# Patient Record
Sex: Female | Born: 2000 | Race: White | Hispanic: No | Marital: Single | Smoking: Never smoker
Health system: Southern US, Community
[De-identification: ages and names within clinical notes are randomized; demographics above are authoritative.]

## PROBLEM LIST (undated history)

## (undated) DIAGNOSIS — M419 Scoliosis, unspecified: Secondary | ICD-10-CM

## (undated) HISTORY — PX: ADENOIDECTOMY: SUR15

## (undated) HISTORY — PX: TONSILLECTOMY: SUR1361

---
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
## 2020-02-29 ENCOUNTER — Ambulatory Visit: Payer: Self-pay | Admitting: *Deleted

## 2020-02-29 NOTE — Telephone Encounter (Signed)
Caller requesting advise due to c/o pain between shoulder blades that is becoming consistent now. Denies chest pain or difficulty breathing. Pain noted since 4 o'clock today. Recent activity this week with moving furniture. Care advise given. Patient verbalized understanding of care advise and to seek help at urgent care or ED if symptoms worsen.   Reason for Disposition . [1] MODERATE back pain (e.g., interferes with normal activities) AND [2] present > 3 days  Answer Assessment - Initial Assessment Questions 1. ONSET: "When did the pain begin?"      4 o'clock today 2. LOCATION: "Where does it hurt?" (upper, mid or lower back)     Between shoulder blades 3. SEVERITY: "How bad is the pain?"  (e.g., Scale 1-10; mild, moderate, or severe)   - MILD (1-3): doesn't interfere with normal activities    - MODERATE (4-7): interferes with normal activities or awakens from sleep    - SEVERE (8-10): excruciating pain, unable to do any normal activities      Moderate  4. PATTERN: "Is the pain constant?" (e.g., yes, no; constant, intermittent)      Becoming more constant 5. RADIATION: "Does the pain shoot into your legs or elsewhere?"     Radiates down back but no where else 6. CAUSE:  "What do you think is causing the back pain?"      Moved furniture earlier this week 7. BACK OVERUSE:  "Any recent lifting of heavy objects, strenuous work or exercise?"     Recent lifting of furniture 8. MEDICATIONS: "What have you taken so far for the pain?" (e.g., nothing, acetaminophen, NSAIDS)     no 9. NEUROLOGIC SYMPTOMS: "Do you have any weakness, numbness, or problems with bowel/bladder control?"     na 10. OTHER SYMPTOMS: "Do you have any other symptoms?" (e.g., fever, abdominal pain, burning with urination, blood in urine)       na 11. PREGNANCY: "Is there any chance you are pregnant?" (e.g., yes, no; LMP)       na  Protocols used: BACK PAIN-A-AH

## 2021-02-11 ENCOUNTER — Encounter: Payer: Self-pay | Admitting: Emergency Medicine

## 2021-02-11 ENCOUNTER — Other Ambulatory Visit: Payer: Self-pay

## 2021-02-11 ENCOUNTER — Emergency Department (INDEPENDENT_AMBULATORY_CARE_PROVIDER_SITE_OTHER)
Admission: EM | Admit: 2021-02-11 | Discharge: 2021-02-11 | Disposition: A | Discharge status: Home / Self Care | Payer: BC Managed Care – PPO | Source: Home / Self Care | Attending: Family Medicine | Admitting: Family Medicine

## 2021-02-11 ENCOUNTER — Emergency Department (INDEPENDENT_AMBULATORY_CARE_PROVIDER_SITE_OTHER): Payer: BC Managed Care – PPO

## 2021-02-11 DIAGNOSIS — M41115 Juvenile idiopathic scoliosis, thoracolumbar region: Secondary | ICD-10-CM

## 2021-02-11 DIAGNOSIS — R52 Pain, unspecified: Secondary | ICD-10-CM

## 2021-02-11 DIAGNOSIS — M545 Low back pain, unspecified: Secondary | ICD-10-CM | POA: Diagnosis not present

## 2021-02-11 MED ORDER — NAPROXEN SODIUM 550 MG PO TABS: 550.0000 mg | ORAL_TABLET | Freq: Two times a day (BID) | ORAL | 0 refills | Status: AC

## 2021-02-11 MED ORDER — CYCLOBENZAPRINE HCL 5 MG PO TABS: 5.0000 mg | ORAL_TABLET | Freq: Three times a day (TID) | ORAL | 0 refills | Status: AC | PRN

## 2021-02-11 NOTE — Discharge Instructions (Addendum)
May use ice or heat to painful back muscles Limit bending and lifting activities Take Anaprox 2 times a day with food Take the muscle relaxer as needed.  This may cause drowsiness.  It is useful at bedtime. Call your doctor if not improving by Monday

## 2021-02-11 NOTE — ED Triage Notes (Signed)
Patient states that she began having low back pain x 1 week, worsen at night.  Since then the pain would radiate up her back to her neck.  Patient states no apparent injury, does have scoliosis.  Having hot and cold chills.  Taken Ibuprofen, denies any urinary sx's.

## 2021-02-12 NOTE — ED Provider Notes (Addendum)
[facility]    CSN: [id number] Arrival date & time: 02/11/21  1210      History   Chief Complaint Chief Complaint  Patient presents with   Back Pain    HPI Katherine Kaiser is a 20 y.o. female.   HPI  Patient has known scoliosis since she was a teenager.  She states that she was evaluated and it was determined that she did not require surgery.  She usually does not have back pain.  Currently she is having back pain for about a week.  It goes from her shoulder blade down to the low back.  She states that she did not have any fall or injury.  No overuse or new activity.  It is not localized to the flank.  No urinary symptoms or hematuria.  No either, no nausea vomiting.  She states she has had some chills.  COVID testing at home was negative. She expresses concern about her scoliosis.  It has not been evaluated in years.  She desires an x-ray Old chart is reviewed.  X-rays from 2014 reviewed.  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      Home Medications    Prior to Admission medications   Medication Sig Start Date End Date Taking? Authorizing Provider  APRI 0.15-30 MG-MCG tablet Take 1 tablet by mouth daily. 01/05/21  Yes [provider]  cyclobenzaprine (FLEXERIL) 5 MG tablet Take 1 tablet (5 mg total) by mouth 3 (three) times daily as needed for muscle spasms. 02/11/21  Yes Eustace Moore, MD  naproxen sodium (ANAPROX DS) 550 MG tablet Take 1 tablet (550 mg total) by mouth 2 (two)  times daily with a meal. 02/11/21  Yes Eustace Moore, MD    Family History Family History  Problem Relation Age of Onset   Hyperlipidemia Mother    Other Mother        ?factor S deficiency   Fibromyalgia Mother    Diabetes Mother        borderline   Polycystic ovary syndrome Mother    Cancer Paternal Grandmother        non hodgkins lymphoma   Hypertension Maternal Grandfather     Social History Social History   Tobacco Use   Smoking status: Never   Smokeless tobacco: Never  Substance Use Topics   Alcohol use: No   Drug use: No     Allergies   Patient has no known allergies.   Review of Systems Review of Systems See HPI  Physical Exam Triage Vital Signs ED Triage Vitals  Enc Vitals Group     BP 02/11/21 1255 116/84     Pulse Rate 02/11/21 1255 90     Resp --      Temp 02/11/21 1255 98.8 F (37.1 C)     Temp Source 02/11/21 1255 Oral     SpO2 02/11/21 1255 100 %     Weight 02/11/21 1256 155 lb (  70.3 kg)     Height 02/11/21 1256 5\' 5"  (1.651 m)     Head Circumference --      Peak Flow --      Pain Score 02/11/21 1256 3     Pain Loc --      Pain Edu? --      Excl. in GC? --    No data found.  Updated Vital Signs BP 116/84 (BP Location: Left Arm)   Pulse 90   Temp 98.8 F (37.1 C) (Oral)   Ht 5\' 5"  (1.651 m)   Wt 70.3 kg   LMP 01/31/2021   SpO2 100%   BMI 25.79 kg/m       Physical Exam Constitutional:      General: She is not in acute distress.    Appearance: She is well-developed and normal weight.  HENT:     Head: Normocephalic and atraumatic.     Mouth/Throat:     Comments: Mask is in place Eyes:     Conjunctiva/sclera: Conjunctivae normal.     Pupils: Pupils are equal, round, and reactive to light.  Cardiovascular:     Rate and Rhythm: Normal rate.  Pulmonary:     Effort: Pulmonary effort is normal. No respiratory distress.  Chest:     Chest wall: No tenderness.  Abdominal:     General: There is no distension.      Palpations: Abdomen is soft.     Tenderness: There is no abdominal tenderness. There is no right CVA tenderness or left CVA tenderness.  Musculoskeletal:        General: Tenderness present. Normal range of motion.     Cervical back: Normal range of motion and neck supple. No tenderness.       Back:  Skin:    General: Skin is warm and dry.  Neurological:     General: No focal deficit present.     Mental Status: She is alert.     Sensory: No sensory deficit.     Motor: No weakness.     Coordination: Coordination normal.     Gait: Gait normal.     Deep Tendon Reflexes: Reflexes normal.  Psychiatric:        Mood and Affect: Mood normal.        Behavior: Behavior normal.     UC Treatments / Results  Labs (all labs ordered are listed, but only abnormal results are displayed) Labs Reviewed - No data to display  EKG   Radiology DG SCOLIOSIS EVAL COMPLETE SPINE 2 OR 3 VIEWS  Result Date: 02/11/2021 CLINICAL DATA:  Low back pain for 1 week EXAM: DG SCOLIOSIS EVAL COMPLETE SPINE 2-3V COMPARISON:  X-rays of the spine dated February 05, 2013 FINDINGS: No acute osseous abnormality. Twelve rib-bearing thoracic type vertebral segments and 5 non rib-bearing lumbar type vertebral segments. No evidence of vertebral segmentation anomaly. Thoracic levocurvature of 10 degrees as measured from the superior endplate of T6 to the inferior endplate of T12, findings are similar when compared with prior x-rays. Thoracolumbar dextrocurvature of 17 degrees as measured from the superior endplate of T11 to the inferior endplate of L3, findings are similar when compared with prior x-ray. Heart size is normal. Lungs are clear. Bowel gas pattern is nonobstructive. IMPRESSION: Thoracolumbar scoliosis with Cobb angles as above. Electronically Signed   By: 02/13/2021 MD   On: 02/11/2021 14:46    Procedures Procedures (including critical care time)  Medications Ordered in UC Medications - No data  to  display  Initial Impression / Assessment and Plan / UC Course  I have reviewed the triage vital signs and the nursing notes.  Pertinent labs & imaging results that were available during my care of the patient were reviewed by me and considered in my medical decision making (see chart for details).     I reassured her that her scoliosis is unchanged.  This appears to be a muscular injury and I will treated as such.  The only thing I can explain is her subjective feeling of chills.  She has no symptoms associated with infection.  She has no physical exam findings that would suggest infection.  She has tested COVID-negative.  This is something I believe she should just follow and report if it does not improve Final Clinical Impressions(s) / UC Diagnoses   Final diagnoses:  Pain  Juvenile idiopathic scoliosis of thoracolumbar region     Discharge Instructions      May use ice or heat to painful back muscles Limit bending and lifting activities Take Anaprox 2 times a day with food Take the muscle relaxer as needed.  This may cause drowsiness.  It is useful at bedtime. Call your doctor if not improving by Monday    ED Prescriptions     Medication Sig Dispense Auth. Provider   naproxen sodium (ANAPROX DS) 550 MG tablet Take 1 tablet (550 mg total) by mouth 2 (two) times daily with a meal. 30 tablet Eustace Moore, MD   cyclobenzaprine (FLEXERIL) 5 MG tablet Take 1 tablet (5 mg total) by mouth 3 (three) times daily as needed for muscle spasms. 30 tablet Eustace Moore, MD      PDMP not reviewed this encounter.   Eustace Moore, MD 02/12/21 [id number]    Eustace Moore, MD 02/12/21 [phone]

## 2022-10-27 IMAGING — DX DG SCOLIOSIS EVAL COMPLETE SPINE 2-3V
2 series · 8 of 8 positions shown · non-contrast
Comparison: X-rays of the spine dated February 05, 2013

CLINICAL DATA: Low back pain for 1 week

EXAM:
DG SCOLIOSIS EVAL COMPLETE SPINE 2-3V

[Series 1: whole body ap · 0.14mm/px · 4 of 4 slices shown]
[im 1/4]
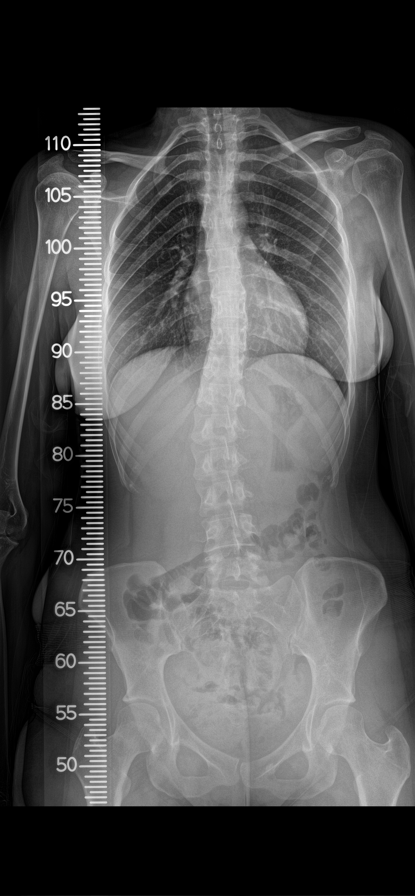
[im 2/4]
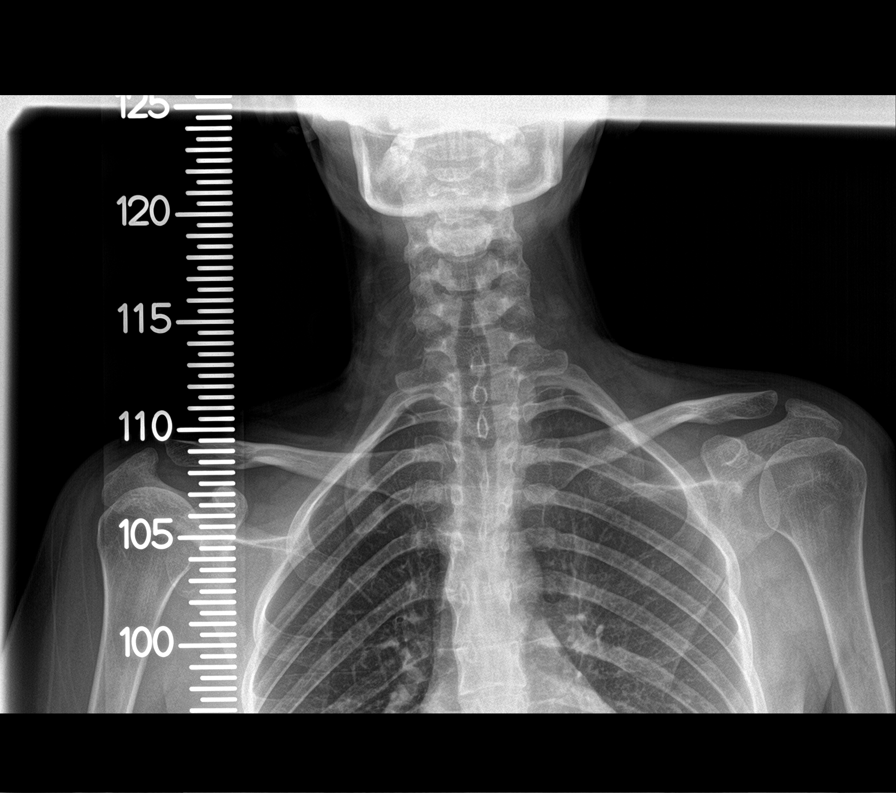
[im 3/4]
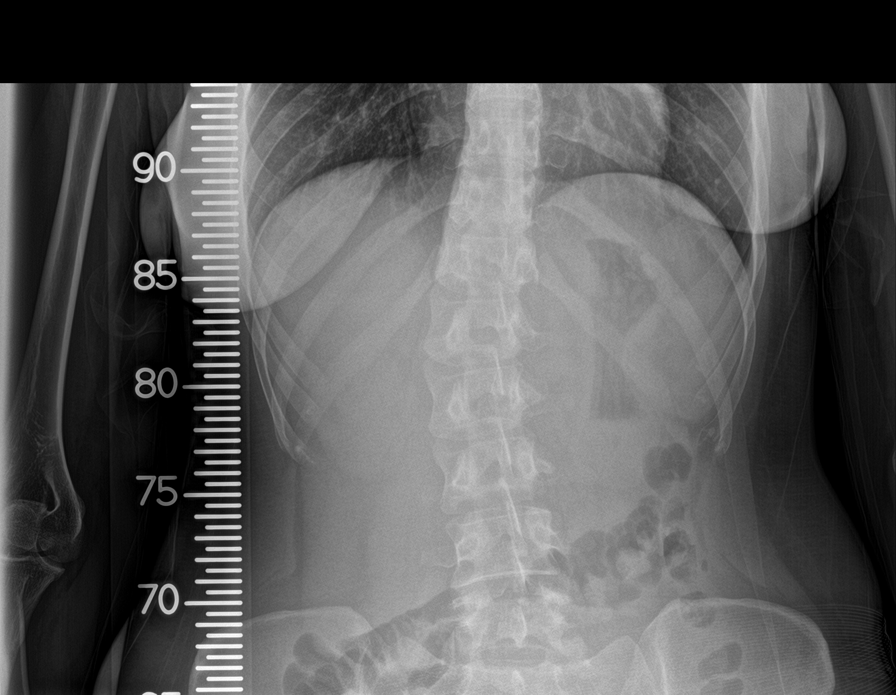
[im 4/4]
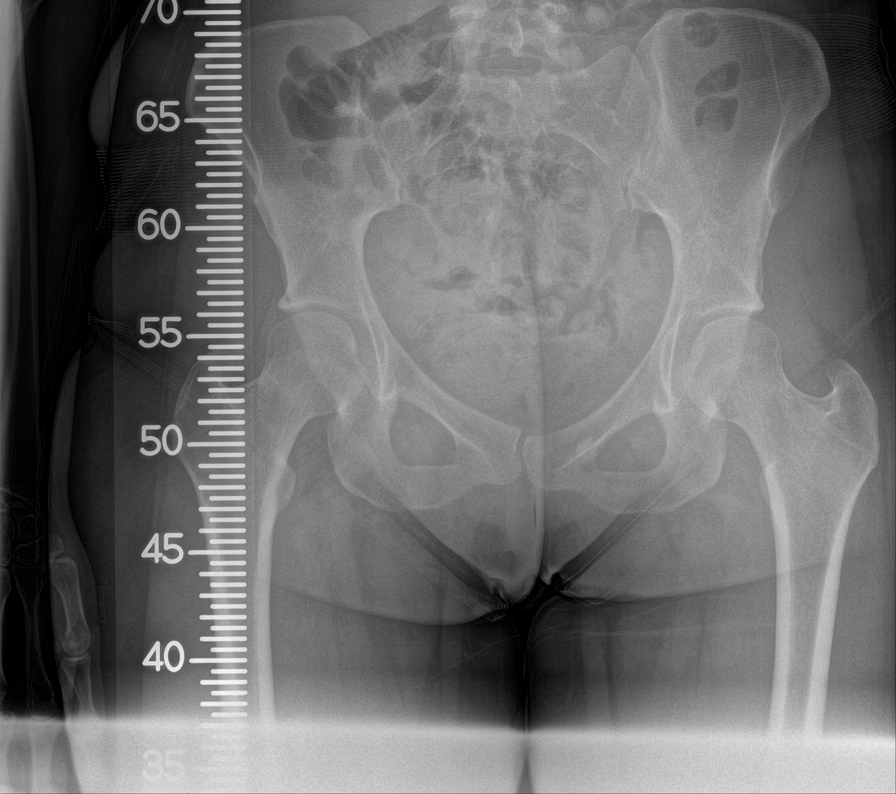

[Series 2: whole body lat · 0.14mm/px · 4 of 4 slices shown]
[im 1/4]
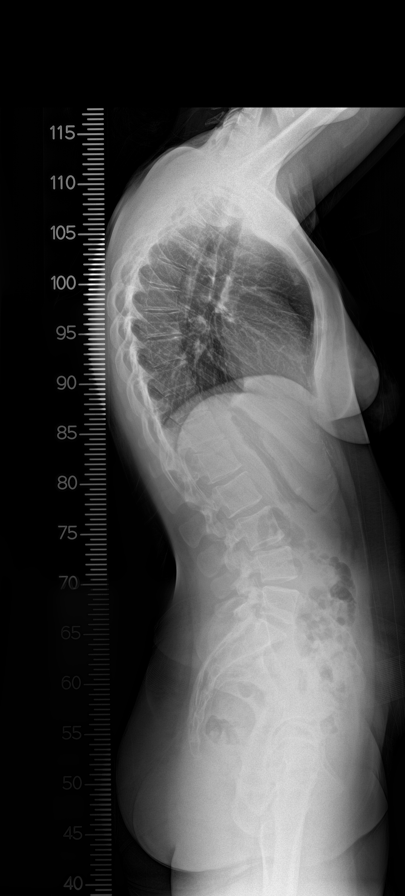
[im 2/4]
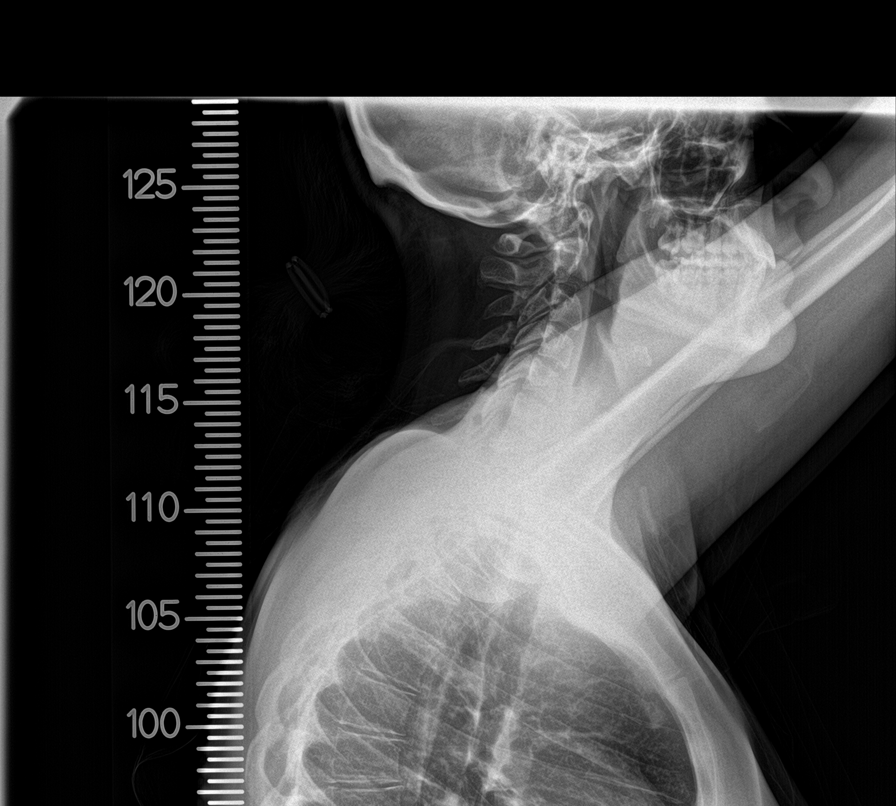
[im 3/4]
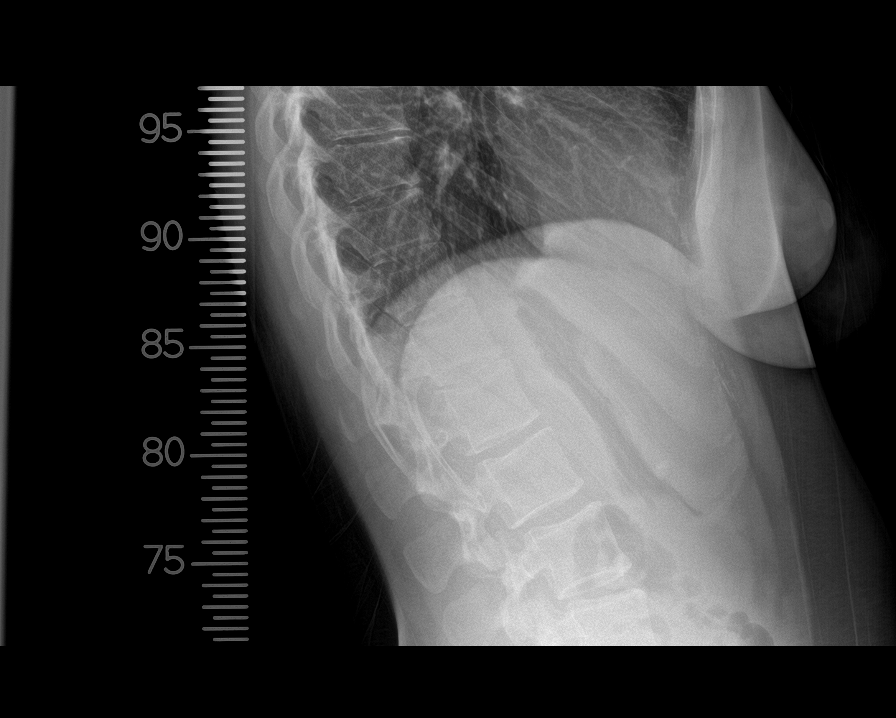
[im 4/4]
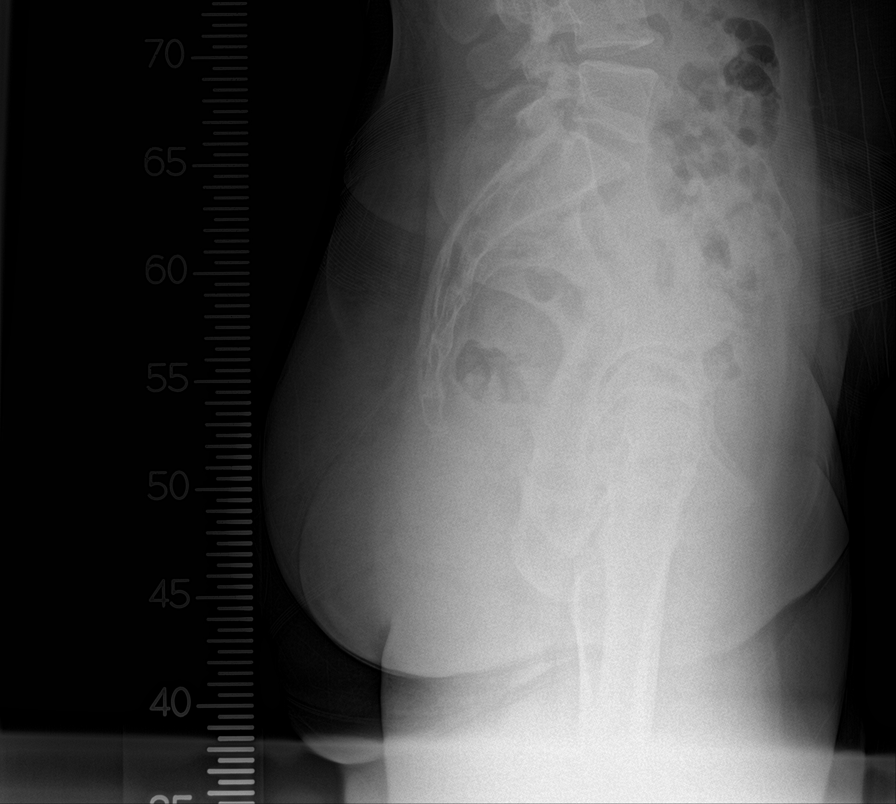

[8 of 8 positions shown; findings below may reference images not displayed]

FINDINGS: No acute osseous abnormality.

Twelve rib-bearing thoracic type vertebral segments and 5 non
rib-bearing lumbar type vertebral segments. No evidence of vertebral
segmentation anomaly.

Thoracic levocurvature of 10 degrees as measured from the superior
endplate of T6 to the inferior endplate of T12, findings are similar
when compared with prior x-rays.

Thoracolumbar dextrocurvature of 17 degrees as measured from the
superior endplate of T11 to the inferior endplate of L3, findings
are similar when compared with prior x-ray.

Heart size is normal. Lungs are clear. Bowel gas pattern is
nonobstructive.
IMPRESSION: Thoracolumbar scoliosis with Kena angles as above.
